# Patient Record
Sex: Female | Born: 1995 | Hispanic: No | Marital: Single | Smoking: Never smoker
Health system: Southern US, Community
[De-identification: ages and names within clinical notes are randomized; demographics above are authoritative.]

## PROBLEM LIST (undated history)

## (undated) ENCOUNTER — Inpatient Hospital Stay (HOSPITAL_COMMUNITY): Payer: Self-pay

## (undated) DIAGNOSIS — Z789 Other specified health status: Secondary | ICD-10-CM

## (undated) HISTORY — PX: GANGLION CYST EXCISION: SHX1691

---
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
## 2013-11-23 ENCOUNTER — Emergency Department (HOSPITAL_COMMUNITY): Admission: EM | Admit: 2013-11-23 | Discharge: 2013-11-23 | Payer: Medicaid Other | Source: Home / Self Care

## 2014-04-26 ENCOUNTER — Encounter (HOSPITAL_COMMUNITY): Payer: Self-pay | Admitting: *Deleted

## 2014-04-26 ENCOUNTER — Inpatient Hospital Stay (HOSPITAL_COMMUNITY)
Admission: AD | Admit: 2014-04-26 | Discharge: 2014-04-26 | Disposition: A | Discharge status: Home / Self Care | Payer: Medicaid Other | Source: Ambulatory Visit | Attending: Obstetrics & Gynecology | Admitting: Obstetrics & Gynecology

## 2014-04-26 DIAGNOSIS — N912 Amenorrhea, unspecified: Secondary | ICD-10-CM

## 2014-04-26 DIAGNOSIS — Z3201 Encounter for pregnancy test, result positive: Secondary | ICD-10-CM | POA: Insufficient documentation

## 2014-04-26 DIAGNOSIS — Z3200 Encounter for pregnancy test, result unknown: Secondary | ICD-10-CM | POA: Diagnosis present

## 2014-04-26 HISTORY — DX: Other specified health status: Z78.9

## 2014-04-26 LAB — POCT PREGNANCY, URINE: Preg Test, Ur: POSITIVE — AB

## 2014-04-26 NOTE — MAU Note (Addendum)
Needs preg verification to file for medicaid. 4+HPT.  Denies any problems.

## 2014-04-26 NOTE — MAU Provider Note (Signed)
  History     CSN: [id number]  Arrival date and time: 04/26/14 0904   None     Chief Complaint  Patient presents with  . Possible Pregnancy   HPI This is a 18 y.o. female at [redacted]w[redacted]d by LMP who presents with request for proof of pregnancy letter. Had 4 positive tests at home. Has no complaints. Has no OB/GYN or primary care provider.   OB History   Grav Para Term Preterm Abortions TAB SAB Ect Mult Living   1               Past Medical History  Diagnosis Date  . Medical history non-contributory     Past Surgical History  Procedure Laterality Date  . Cystectomy  2009    from wrist  . Ganglion cyst excision      Family History  Problem Relation Age of Onset  . Diabetes Mother     History  Substance Use Topics  . Smoking status: Never Smoker   . Smokeless tobacco: Never Used  . Alcohol Use: No    Allergies: No Known Allergies  Prescriptions prior to admission  Medication Sig Dispense Refill  . cetirizine (ZYRTEC) 10 MG tablet Take 1 tablet (10 mg total) by mouth daily.  30 tablet  0    Review of Systems  Constitutional: Negative for fever, chills and malaise/fatigue.  Gastrointestinal: Negative for nausea, vomiting and abdominal pain.  Neurological: Negative for dizziness.   Physical Exam   Blood pressure 118/70, pulse 98, temperature 98.3 F (36.8 C), temperature source Oral, resp. rate 18, height 4' 10.5" (1.486 m), weight 103 lb (46.72 kg), last menstrual period 03/19/2014.  Physical Exam  Constitutional: She is oriented to person, place, and time. She appears well-developed and well-nourished. No distress.  HENT:  Head: Normocephalic.  Cardiovascular: Normal rate.   Respiratory: Effort normal.  Genitourinary:  Exam not indicated   Musculoskeletal: Normal range of motion.  Neurological: She is alert and oriented to person, place, and time.  Skin: Skin is warm and dry.  Psychiatric: She has a normal mood and affect.    MAU Course   Procedures  MDM Results for orders placed during the hospital encounter of 04/26/14 (from the past 24 hour(s))  POCT PREGNANCY, URINE     Status: Abnormal   Collection Time    04/26/14  9:25 AM      Result Value Ref Range   Preg Test, Ur POSITIVE (*) NEGATIVE     Assessment and Plan  A:  Pregnancy at [redacted]w[redacted]d by LMP      No complaints   P:  Proof of pregnancy letter provided       Instructed in early pregnancy guidelines       Referred to [facility]       Provider list and medicaid instructions.  [facility] 04/26/2014, 9:29 AM

## 2014-04-26 NOTE — MAU Provider Note (Signed)
Attestation of Attending Supervision of Advanced Practitioner (PA/CNM/NP): Evaluation and management procedures were performed by the Advanced Practitioner under my supervision and collaboration.  I have reviewed the Advanced Practitioner's note and chart, and I agree with the management and plan.  Jewel Mcafee, MD, FACOG Attending Obstetrician & Gynecologist Faculty Practice, [facility] -    

## 2014-04-26 NOTE — Discharge Instructions (Signed)
First Trimester of Pregnancy The first trimester of pregnancy is from week 1 until the end of week 12 (months 1 through 3). A week after a sperm fertilizes an egg, the egg will implant on the wall of the uterus. This embryo will begin to develop into a baby. Genes from you and your partner are forming the baby. The female genes determine whether the baby is a boy or a girl. At 6-8 weeks, the eyes and face are formed, and the heartbeat can be seen on ultrasound. At the end of 12 weeks, all the baby's organs are formed.  Now that you are pregnant, you will want to do everything you can to have a healthy baby. Two of the most important things are to get good prenatal care and to follow your health care provider's instructions. Prenatal care is all the medical care you receive before the baby's birth. This care will help prevent, find, and treat any problems during the pregnancy and childbirth. BODY CHANGES Your body goes through many changes during pregnancy. The changes vary from woman to woman.   You may gain or lose a couple of pounds at first.  You may feel sick to your stomach (nauseous) and throw up (vomit). If the vomiting is uncontrollable, call your health care provider.  You may tire easily.  You may develop headaches that can be relieved by medicines approved by your health care provider.  You may urinate more often. Painful urination may mean you have a bladder infection.  You may develop heartburn as a result of your pregnancy.  You may develop constipation because certain hormones are causing the muscles that push waste through your intestines to slow down.  You may develop hemorrhoids or swollen, bulging veins (varicose veins).  Your breasts may begin to grow larger and become tender. Your nipples may stick out more, and the tissue that surrounds them (areola) may become darker.  Your gums may bleed and may be sensitive to brushing and flossing.  Dark spots or blotches (chloasma,  mask of pregnancy) may develop on your face. This will likely fade after the baby is born.  Your menstrual periods will stop.  You may have a loss of appetite.  You may develop cravings for certain kinds of food.  You may have changes in your emotions from day to day, such as being excited to be pregnant or being concerned that something may go wrong with the pregnancy and baby.  You may have more vivid and strange dreams.  You may have changes in your hair. These can include thickening of your hair, rapid growth, and changes in texture. Some women also have hair loss during or after pregnancy, or hair that feels dry or thin. Your hair will most likely return to normal after your baby is born. WHAT TO EXPECT AT YOUR PRENATAL VISITS During a routine prenatal visit:  You will be weighed to make sure you and the baby are growing normally.  Your blood pressure will be taken.  Your abdomen will be measured to track your baby's growth.  The fetal heartbeat will be listened to starting around week 10 or 12 of your pregnancy.  Test results from any previous visits will be discussed. Your health care provider may ask you:  How you are feeling.  If you are feeling the baby move.  If you have had any abnormal symptoms, such as leaking fluid, bleeding, severe headaches, or abdominal cramping.  If you have any questions. Other tests   that may be performed during your first trimester include:  Blood tests to find your blood type and to check for the presence of any previous infections. They will also be used to check for low iron levels (anemia) and Rh antibodies. Later in the pregnancy, blood tests for diabetes will be done along with other tests if problems develop.  Urine tests to check for infections, diabetes, or protein in the urine.  An ultrasound to confirm the proper growth and development of the baby.  An amniocentesis to check for possible genetic problems.  Fetal screens for  spina bifida and Down syndrome.  You may need other tests to make sure you and the baby are doing well. HOME CARE INSTRUCTIONS  Medicines  Follow your health care provider's instructions regarding medicine use. Specific medicines may be either safe or unsafe to take during pregnancy.  Take your prenatal vitamins as directed.  If you develop constipation, try taking a stool softener if your health care provider approves. Diet  Eat regular, well-balanced meals. Choose a variety of foods, such as meat or vegetable-based protein, fish, milk and low-fat dairy products, vegetables, fruits, and whole grain breads and cereals. Your health care provider will help you determine the amount of weight gain that is right for you.  Avoid raw meat and uncooked cheese. These carry germs that can cause birth defects in the baby.  Eating four or five small meals rather than three large meals a day may help relieve nausea and vomiting. If you start to feel nauseous, eating a few soda crackers can be helpful. Drinking liquids between meals instead of during meals also seems to help nausea and vomiting.  If you develop constipation, eat more high-fiber foods, such as fresh vegetables or fruit and whole grains. Drink enough fluids to keep your urine clear or pale yellow. Activity and Exercise  Exercise only as directed by your health care provider. Exercising will help you:  Control your weight.  Stay in shape.  Be prepared for labor and delivery.  Experiencing pain or cramping in the lower abdomen or low back is a good sign that you should stop exercising. Check with your health care provider before continuing normal exercises.  Try to avoid standing for long periods of time. Move your legs often if you must stand in one place for a long time.  Avoid heavy lifting.  Wear low-heeled shoes, and practice good posture.  You may continue to have sex unless your health care provider directs you  otherwise. Relief of Pain or Discomfort  Wear a good support bra for breast tenderness.   Take warm sitz baths to soothe any pain or discomfort caused by hemorrhoids. Use hemorrhoid cream if your health care provider approves.   Rest with your legs elevated if you have leg cramps or low back pain.  If you develop varicose veins in your legs, wear support hose. Elevate your feet for 15 minutes, 3-4 times a day. Limit salt in your diet. Prenatal Care  Schedule your prenatal visits by the twelfth week of pregnancy. They are usually scheduled monthly at first, then more often in the last 2 months before delivery.  Write down your questions. Take them to your prenatal visits.  Keep all your prenatal visits as directed by your health care provider. Safety  Wear your seat belt at all times when driving.  Make a list of emergency phone numbers, including numbers for family, friends, the hospital, and police and fire departments. General Tips    Ask your health care provider for a referral to a local prenatal education class. Begin classes no later than at the beginning of month 6 of your pregnancy.  Ask for help if you have counseling or nutritional needs during pregnancy. Your health care provider can offer advice or refer you to specialists for help with various needs.  Do not use hot tubs, steam rooms, or saunas.  Do not douche or use tampons or scented sanitary pads.  Do not cross your legs for long periods of time.  Avoid cat litter boxes and soil used by cats. These carry germs that can cause birth defects in the baby and possibly loss of the fetus by miscarriage or stillbirth.  Avoid all smoking, herbs, alcohol, and medicines not prescribed by your health care provider. Chemicals in these affect the formation and growth of the baby.  Schedule a dentist appointment. At home, brush your teeth with a soft toothbrush and be gentle when you floss. SEEK MEDICAL CARE IF:   You have  dizziness.  You have mild pelvic cramps, pelvic pressure, or nagging pain in the abdominal area.  You have persistent nausea, vomiting, or diarrhea.  You have a bad smelling vaginal discharge.  You have pain with urination.  You notice increased swelling in your face, hands, legs, or ankles. SEEK IMMEDIATE MEDICAL CARE IF:   You have a fever.  You are leaking fluid from your vagina.  You have spotting or bleeding from your vagina.  You have severe abdominal cramping or pain.  You have rapid weight gain or loss.  You vomit blood or material that looks like coffee grounds.  You are exposed to German measles and have never had them.  You are exposed to fifth disease or chickenpox.  You develop a severe headache.  You have shortness of breath.  You have any kind of trauma, such as from a fall or a car accident. Document Released: 07/13/2001 Document Revised: 12/03/2013 Document Reviewed: 05/29/2013 Exit[employer] Patient Information 2015 [employer]. This information is not intended to replace advice given to you by your health care provider. Make sure you discuss any questions you have with your health care provider.  

## 2014-05-23 ENCOUNTER — Other Ambulatory Visit (HOSPITAL_COMMUNITY): Payer: Self-pay | Admitting: Physician Assistant

## 2014-05-23 DIAGNOSIS — Z3682 Encounter for antenatal screening for nuchal translucency: Secondary | ICD-10-CM

## 2014-05-23 LAB — OB RESULTS CONSOLE ABO/RH: RH TYPE: POSITIVE

## 2014-05-23 LAB — OB RESULTS CONSOLE HEPATITIS B SURFACE ANTIGEN: HEP B S AG: NEGATIVE

## 2014-05-23 LAB — OB RESULTS CONSOLE GC/CHLAMYDIA
CHLAMYDIA, DNA PROBE: NEGATIVE
Gonorrhea: NEGATIVE

## 2014-05-23 LAB — OB RESULTS CONSOLE ANTIBODY SCREEN: ANTIBODY SCREEN: NEGATIVE

## 2014-05-23 LAB — OB RESULTS CONSOLE HIV ANTIBODY (ROUTINE TESTING): HIV: NONREACTIVE

## 2014-05-23 LAB — OB RESULTS CONSOLE RUBELLA ANTIBODY, IGM: Rubella: IMMUNE

## 2014-05-23 LAB — OB RESULTS CONSOLE RPR: RPR: NONREACTIVE

## 2014-06-03 ENCOUNTER — Encounter (HOSPITAL_COMMUNITY): Payer: Self-pay | Admitting: *Deleted

## 2014-06-12 ENCOUNTER — Ambulatory Visit (HOSPITAL_COMMUNITY)
Admission: RE | Admit: 2014-06-12 | Discharge: 2014-06-12 | Disposition: A | Discharge status: Home / Self Care | Payer: Medicaid Other | Source: Ambulatory Visit | Attending: Physician Assistant | Admitting: Physician Assistant

## 2014-06-12 DIAGNOSIS — Z3A12 12 weeks gestation of pregnancy: Secondary | ICD-10-CM | POA: Diagnosis not present

## 2014-06-12 DIAGNOSIS — Z3682 Encounter for antenatal screening for nuchal translucency: Secondary | ICD-10-CM

## 2014-06-12 DIAGNOSIS — Z36 Encounter for antenatal screening of mother: Secondary | ICD-10-CM | POA: Insufficient documentation

## 2014-06-20 ENCOUNTER — Other Ambulatory Visit (HOSPITAL_COMMUNITY): Payer: Self-pay

## 2014-07-18 ENCOUNTER — Other Ambulatory Visit (HOSPITAL_COMMUNITY): Payer: Self-pay | Admitting: Nurse Practitioner

## 2014-07-18 DIAGNOSIS — Z3689 Encounter for other specified antenatal screening: Secondary | ICD-10-CM

## 2014-08-01 ENCOUNTER — Ambulatory Visit (HOSPITAL_COMMUNITY)
Admission: RE | Admit: 2014-08-01 | Discharge: 2014-08-01 | Disposition: A | Discharge status: Home / Self Care | Payer: Medicaid Other | Source: Ambulatory Visit | Attending: Nurse Practitioner | Admitting: Nurse Practitioner

## 2014-08-01 DIAGNOSIS — Z3A19 19 weeks gestation of pregnancy: Secondary | ICD-10-CM | POA: Diagnosis present

## 2014-08-01 DIAGNOSIS — Z36 Encounter for antenatal screening of mother: Secondary | ICD-10-CM | POA: Insufficient documentation

## 2014-08-01 DIAGNOSIS — Z3689 Encounter for other specified antenatal screening: Secondary | ICD-10-CM

## 2014-08-02 NOTE — L&D Delivery Note (Cosign Needed)
Delivery Note Pt pushed well x 2 hours and at 6:58 AM a viable female was delivered via Vaginal, Spontaneous Delivery (Presentation: OA ).  APGAR: 8, 9; weight 8 lb 6.8 oz (3822 g).  Peds present at del, but infant crying spont w/ stimulation and bulb suctioning, so they were dismissed. Cord clamped and cut by FOB. Hospital cord blood sample collected. Placenta status: Intact, Spontaneous.  Cord: 3 vessels    Anesthesia: Epidural  Episiotomy: None Lacerations: 1st degree;Sulcus;Perineal Suture Repair: 3.0 vicryl Est. Blood Loss (mL):    Mom to postpartum.  Baby to Couplet care / Skin to Skin.  Cam HaiSHAW, KIMBERLY CNM 01/02/2015, 7:25 AM

## 2014-08-03 DIAGNOSIS — Z3A19 19 weeks gestation of pregnancy: Secondary | ICD-10-CM | POA: Insufficient documentation

## 2014-08-03 DIAGNOSIS — Z3689 Encounter for other specified antenatal screening: Secondary | ICD-10-CM | POA: Insufficient documentation

## 2014-09-16 ENCOUNTER — Other Ambulatory Visit (HOSPITAL_COMMUNITY): Payer: Self-pay | Admitting: Nurse Practitioner

## 2014-09-16 DIAGNOSIS — Z3689 Encounter for other specified antenatal screening: Secondary | ICD-10-CM

## 2014-09-25 ENCOUNTER — Ambulatory Visit (HOSPITAL_COMMUNITY)
Admission: RE | Admit: 2014-09-25 | Discharge: 2014-09-25 | Disposition: A | Discharge status: Home / Self Care | Payer: Medicaid Other | Source: Ambulatory Visit | Attending: Nurse Practitioner | Admitting: Nurse Practitioner

## 2014-09-25 DIAGNOSIS — Z3689 Encounter for other specified antenatal screening: Secondary | ICD-10-CM

## 2014-09-25 DIAGNOSIS — Z36 Encounter for antenatal screening of mother: Secondary | ICD-10-CM | POA: Diagnosis present

## 2014-09-25 DIAGNOSIS — Z3A27 27 weeks gestation of pregnancy: Secondary | ICD-10-CM | POA: Insufficient documentation

## 2014-09-25 DIAGNOSIS — Z0489 Encounter for examination and observation for other specified reasons: Secondary | ICD-10-CM | POA: Insufficient documentation

## 2014-09-25 DIAGNOSIS — IMO0002 Reserved for concepts with insufficient information to code with codable children: Secondary | ICD-10-CM | POA: Insufficient documentation

## 2014-12-02 LAB — OB RESULTS CONSOLE GC/CHLAMYDIA
Chlamydia: NEGATIVE
GC PROBE AMP, GENITAL: NEGATIVE

## 2014-12-02 LAB — OB RESULTS CONSOLE GBS: GBS: NEGATIVE

## 2014-12-23 ENCOUNTER — Encounter (HOSPITAL_COMMUNITY): Payer: Self-pay | Admitting: *Deleted

## 2014-12-23 ENCOUNTER — Inpatient Hospital Stay (HOSPITAL_COMMUNITY)
Admission: AD | Admit: 2014-12-23 | Discharge: 2014-12-23 | Disposition: A | Discharge status: Home / Self Care | Payer: Medicaid Other | Source: Ambulatory Visit | Attending: Obstetrics & Gynecology | Admitting: Obstetrics & Gynecology

## 2014-12-23 DIAGNOSIS — R42 Dizziness and giddiness: Secondary | ICD-10-CM | POA: Diagnosis present

## 2014-12-23 DIAGNOSIS — O9989 Other specified diseases and conditions complicating pregnancy, childbirth and the puerperium: Secondary | ICD-10-CM | POA: Insufficient documentation

## 2014-12-23 DIAGNOSIS — Z833 Family history of diabetes mellitus: Secondary | ICD-10-CM | POA: Insufficient documentation

## 2014-12-23 DIAGNOSIS — Z3A39 39 weeks gestation of pregnancy: Secondary | ICD-10-CM | POA: Diagnosis not present

## 2014-12-23 DIAGNOSIS — E162 Hypoglycemia, unspecified: Secondary | ICD-10-CM | POA: Insufficient documentation

## 2014-12-23 LAB — URINALYSIS, ROUTINE W REFLEX MICROSCOPIC
Bilirubin Urine: NEGATIVE
Glucose, UA: NEGATIVE mg/dL
Hgb urine dipstick: NEGATIVE
KETONES UR: NEGATIVE mg/dL
Leukocytes, UA: NEGATIVE
Nitrite: NEGATIVE
Protein, ur: NEGATIVE mg/dL
SPECIFIC GRAVITY, URINE: 1.025 (ref 1.005–1.030)
UROBILINOGEN UA: 1 mg/dL (ref 0.0–1.0)
pH: 5.5 (ref 5.0–8.0)

## 2014-12-23 MED ORDER — CETIRIZINE HCL 10 MG PO TABS: 10.0000 mg | ORAL_TABLET | Freq: Every day | ORAL | Status: AC

## 2014-12-23 NOTE — MAU Provider Note (Signed)
  History   G1 at 39.6 wks was standing up cooking and had a episode of dizziness, weakness blurred vison and spots lasted about 10 min. After reviewing pts diet for the day she had earlier eaten sweet cereal and had not had any substantial protien all day. Discussed importance of protein in diet and decreasing carb load.  CSN: [id number]  Arrival date and time: 12/23/14 [id number]   First Provider Initiated Contact with Patient 12/23/14 1925      Chief Complaint  Patient presents with  . Dizziness  . Numbness   HPI  OB History    Gravida Para Term Preterm AB TAB SAB Ectopic Multiple Living   1               Past Medical History  Diagnosis Date  . Medical history non-contributory     Past Surgical History  Procedure Laterality Date  . Cystectomy  2009    from wrist  . Ganglion cyst excision      Family History  Problem Relation Age of Onset  . Diabetes Mother     History  Substance Use Topics  . Smoking status: Never Smoker   . Smokeless tobacco: Never Used  . Alcohol Use: No    Allergies: No Known Allergies  Prescriptions prior to admission  Medication Sig Dispense Refill Last Dose  . cetirizine (ZYRTEC) 10 MG tablet Take 1 tablet (10 mg total) by mouth daily. 30 tablet 0     Review of Systems  HENT: Negative.   Eyes: Positive for blurred vision.  Respiratory: Negative.   Cardiovascular: Negative.   Gastrointestinal: Negative.   Genitourinary: Negative.   Musculoskeletal: Negative.   Skin: Negative.   Neurological: Positive for weakness.  Endo/Heme/Allergies: Negative.   Psychiatric/Behavioral: Negative.    Physical Exam   Blood pressure 125/80, pulse 85, temperature 98.5 F (36.9 C), temperature source Oral, resp. rate 18, weight 145 lb (65.772 kg), last menstrual period 03/19/2014.  Physical Exam  Constitutional: She is oriented to person, place, and time. She appears well-developed and well-nourished.  HENT:  Head: Normocephalic.  Eyes: Pupils  are equal, round, and reactive to light.  Neck: Normal range of motion.  Cardiovascular: Normal rate, regular rhythm, normal heart sounds and intact distal pulses.   Respiratory: Effort normal and breath sounds normal.  GI: Soft.  Genitourinary: Vagina normal.  Musculoskeletal: Normal range of motion.  Neurological: She is alert and oriented to person, place, and time. She has normal reflexes.  Skin: Skin is warm and dry.  Psychiatric: She has a normal mood and affect. Her behavior is normal. Judgment and thought content normal.    MAU Course  Procedures  MDM Hypoglycemic episode  Assessment and Plan  Stable pt and fetus will d/c home with reassurring FHR  Sasha Rueth DARLENE 12/23/2014, 7:28 PM

## 2014-12-23 NOTE — MAU Note (Signed)
Pt states that at 5:45 she started feeling dizzy and lightheaded, then about 30 minutes it got real bad and she got blurred vision with light flickers, a very bad headache, and then her tongue, left side of her face and left arm went numb. She felt like she was going to faint so she went to the bed and almost fell back (but did not). She said that for about 12 minutes she couldn't see hardly at all bc of the dizziness and light flickers. She now feels better, just a minor headache and a little tingly in her hand. Has no OB complaints. Denies LOF, VB or CTX. +FM.

## 2014-12-23 NOTE — MAU Note (Signed)
Started about ago, very dizzy, facial numbness on right side of face; headache, seeing white lights.

## 2014-12-23 NOTE — Discharge Instructions (Signed)
Hipoglucemia (Hypoglycemia) La hipoglucemia se produce cuando el nivel de glucosa en la sangre es demasiado bajo. La glucosa es un tipo de azcar, que es la principal fuente de energa del cuerpo. Hormonas, como la insulina y el glucagn, controlan el nivel de glucosa en la sangre. La insulina reduce el nivel de la glucosa en la sangre, mientras que el glucagn lo aumenta. Si tiene demasiada insulina en el torrente sanguneo o si no ingiere suficientes alimentos que contengan azcar, puede desarrollar hipoglucemia. Esta afeccin puede manifestarse en personas con o sin diabetes. Puede desarrollarse rpidamente y, como consecuencia, necesitar atencin urgente.  CAUSAS   Omitir o retrasar comidas.  No ingerir demasiados carbohidratos en las comidas.  Consumo excesivo de medicamentos para la diabetes.  No coordinar el horario de la toma de medicamentos por va oral para la diabetes o de insulina, con las comidas, las colaciones y el ejercicio.  Nuseas y vmitos.  Algunos medicamentos.  Enfermedades graves, como hepatitis, trastornos renales y ciertos trastornos de la alimentacin.  Aumento de la actividad fsica o el ejercicio, sin ingerir alimentos adicionales o ajustar los medicamentos.  Beber alcohol en exceso.  Un trastorno nervioso que afecta las funciones corporales, como la frecuencia cardaca, presin arterial y digestin (neuropata autnoma).  Una afeccin en la cual los msculos del estmago no funcionan apropiadamente (gastroparesia). Por consiguiente, los medicamentos y los alimentos no pueden absorberse correctamente.  Pocas veces un tumor de pncreas puede producir demasiada insulina. SNTOMAS   Hambre.  Sudoraciones (diaforesis).  Cambio en la temperatura corporal.  Temblores.  Dolor de cabeza.  Ansiedad.  Aturdimiento.  Irritabilidad.  Dificultad para concentrarse.  Sequedad en la boca.  Hormigueo o adormecimiento de las manos y los pies.  Sueo  agitado o alteraciones del sueo.  Alteracin en el habla y la coordinacin.  Cambio en el estado mental.  Convulsiones breves o prolongadas.  Agresividad  Somnolencia (letargo).  Debilidad.  Aumento de la frecuencia cardaca o palpitaciones.  Confusin.  Piel plida o de tono gris.  Visin borrosa o doble.  Desmayos. DIAGNSTICO  Le harn un examen fsico y una historia clnica. Su mdico puede hacer un diagnstico en funcin de sus sntomas. Pueden realizarle anlisis de sangre y otras pruebas de laboratorio para confirmar el diagnstico. Una vez realizado el diagnstico, su mdico observar si los signos y sntomas desaparecen, una vez que aumenta el nivel de la glucosa en la sangre.  TRATAMIENTO  Por lo general, la hipoglucemia puede tratarse fcilmente cuando se observan sntomas.  Controle su nivel de glucosa en la sangre. Si es menor que 70 mg/dl, tome uno de los siguientes:  3 o 4 comprimidos de glucosa.   taza de jugo.   taza de una gaseosa comn.  1 taza de leche descremada.   a 1 pomo de glucosa en gel.  5 a 6 caramelos duros.  Evite las bebidas o los alimentos con alto contenido de grasa, que pueden retrasar el aumento de los niveles de glucosa en la sangre.  No ingiera ms de la cantidad recomendada de alimentos, bebidas, gel o comprimidos que contengan azcar. Si lo hace, el nivel de glucosa en la sangre subir demasiado.  Espere de 10 a 15 minutos y vuelva a controlar su nivel de glucosa en la sangre. Si an es menor que 70 mg/dl o est por debajo del intervalo indicado, repita el tratamiento.  Ingiera una colacin si falta ms de 1 hora para su prxima comida. Es posible que, alguna vez, su   nivel de glucosa en la sangre baje demasiado, de modo que no pueda tratarse en su casa, cuando comience a observar los sntomas. Probablemente necesite ayuda. Incluso puede desmayarse o ser incapaz de tragar. Si no puede tratarse por s solo, alguien deber  llevarlo al hospital.  INSTRUCCIONES PARA EL CUIDADO EN EL HOGAR  Si tiene diabetes, siga su plan de control de la diabetes:  Tome los medicamentos segn las indicaciones.  Siga el plan de ejercicio.  Siga el plan de comidas. No saltee comidas. Coma a horario.  Controle su nivel de glucosa en la sangre peridicamente. Controle su nivel de glucosa en la sangre antes y despus de ejercitarse. Si hace ejercicio durante ms tiempo o de manera diferente de lo habitual, asegrese de controlar su nivel de glucosa en la sangre con mayor frecuencia.  Use su pulsera o medalla de alerta mdica, que indica que usted tiene diabetes.  Identifique la causa de su hipoglucemia. Luego, desarrolle formas de prevenir la recurrencia de la hipoglucemia.  No tome un bao o una ducha caliente inmediatamente despus de una inyeccin de insulina.  Siempre lleve consigo el tratamiento. Las pastillas de glucosa son fciles de llevar.  Si va a beber alcohol, bbalo solo con las comidas.  Informe a familiares y amigos qu pueden hacer para mantenerlo seguro durante una convulsin. Esto puede incluir retirar objetos duros o filosos del rea o colocarlo de costado.  Mantenga un peso saludable. SOLICITE ATENCIN MDICA SI:   Tiene problemas para mantener el nivel de glucosa en la sangre dentro del intervalo indicado.  Tiene episodios frecuentes de hipoglucemia.  Siente efectos secundarios por los medicamentos prescritos.  No est seguro por qu su nivel de glucosa en la sangre es tan bajo.  Nota cambios o un nuevo problema en la visin . SOLICITE ATENCIN MDICA DE INMEDIATO SI:   Presenta confusin.  Se produce un cambio en su estado mental.  Es incapaz de tragar.  Se desmaya. Document Released: 07/19/2005 Document Revised: 07/24/2013 ExitCare Patient Information 2015 [employer]. This information is not intended to replace advice given to you by your health care provider. Make sure you discuss  any questions you have with your health care provider.  

## 2014-12-26 ENCOUNTER — Other Ambulatory Visit (HOSPITAL_COMMUNITY): Payer: Self-pay | Admitting: Nurse Practitioner

## 2014-12-26 ENCOUNTER — Telehealth (HOSPITAL_COMMUNITY): Payer: Self-pay | Admitting: *Deleted

## 2014-12-26 ENCOUNTER — Encounter (HOSPITAL_COMMUNITY): Payer: Self-pay | Admitting: *Deleted

## 2014-12-26 DIAGNOSIS — O480 Post-term pregnancy: Secondary | ICD-10-CM

## 2014-12-26 NOTE — Telephone Encounter (Signed)
Preadmission screen  

## 2014-12-27 ENCOUNTER — Inpatient Hospital Stay (HOSPITAL_COMMUNITY)
Admission: EM | Admit: 2014-12-27 | Discharge: 2014-12-27 | Disposition: A | Discharge status: Home / Self Care | Payer: Medicaid Other | Source: Ambulatory Visit | Attending: Family Medicine | Admitting: Family Medicine

## 2014-12-27 ENCOUNTER — Encounter (HOSPITAL_COMMUNITY): Payer: Self-pay | Admitting: *Deleted

## 2014-12-27 ENCOUNTER — Ambulatory Visit (HOSPITAL_COMMUNITY)
Admission: RE | Admit: 2014-12-27 | Discharge: 2014-12-27 | Disposition: A | Discharge status: Home / Self Care | Payer: Medicaid Other | Source: Ambulatory Visit | Attending: Nurse Practitioner | Admitting: Nurse Practitioner

## 2014-12-27 DIAGNOSIS — O480 Post-term pregnancy: Secondary | ICD-10-CM

## 2014-12-27 DIAGNOSIS — Z3A40 40 weeks gestation of pregnancy: Secondary | ICD-10-CM | POA: Insufficient documentation

## 2014-12-27 DIAGNOSIS — Z3A41 41 weeks gestation of pregnancy: Secondary | ICD-10-CM | POA: Diagnosis not present

## 2014-12-27 NOTE — MAU Provider Note (Signed)
Patient is 19 y.o. G1P0 10274w3d referred from clinic after BPP 6/8 for NST.  NST reviewed and reactive.  Perry MountACOSTA,Kritika Stukes ROCIO, MD 12:21 PM

## 2014-12-27 NOTE — MAU Note (Signed)
Pt here from clinic this morning for further monitoring after BPP 6/8.  Pt states baby is very active.  Denies vaginal bleeding or ROM.

## 2015-01-01 ENCOUNTER — Inpatient Hospital Stay (HOSPITAL_COMMUNITY): Payer: Medicaid Other | Admitting: Anesthesiology

## 2015-01-01 ENCOUNTER — Encounter (HOSPITAL_COMMUNITY): Payer: Self-pay

## 2015-01-01 ENCOUNTER — Inpatient Hospital Stay (HOSPITAL_COMMUNITY)
Admission: RE | Admit: 2015-01-01 | Discharge: 2015-01-04 | DRG: 775 | Disposition: A | Discharge status: Home / Self Care | Payer: Medicaid Other | Source: Ambulatory Visit | Attending: Family Medicine | Admitting: Family Medicine

## 2015-01-01 DIAGNOSIS — D62 Acute posthemorrhagic anemia: Secondary | ICD-10-CM | POA: Diagnosis not present

## 2015-01-01 DIAGNOSIS — Z3A41 41 weeks gestation of pregnancy: Secondary | ICD-10-CM | POA: Diagnosis present

## 2015-01-01 DIAGNOSIS — IMO0001 Reserved for inherently not codable concepts without codable children: Secondary | ICD-10-CM

## 2015-01-01 DIAGNOSIS — O9081 Anemia of the puerperium: Secondary | ICD-10-CM | POA: Diagnosis not present

## 2015-01-01 DIAGNOSIS — Z3490 Encounter for supervision of normal pregnancy, unspecified, unspecified trimester: Secondary | ICD-10-CM

## 2015-01-01 DIAGNOSIS — O480 Post-term pregnancy: Principal | ICD-10-CM | POA: Diagnosis present

## 2015-01-01 LAB — CBC
HCT: 34.6 % — ABNORMAL LOW (ref 36.0–46.0)
Hemoglobin: 11.4 g/dL — ABNORMAL LOW (ref 12.0–15.0)
MCH: 27.8 pg (ref 26.0–34.0)
MCHC: 32.9 g/dL (ref 30.0–36.0)
MCV: 84.4 fL (ref 78.0–100.0)
PLATELETS: 208 10*3/uL (ref 150–400)
RBC: 4.1 MIL/uL (ref 3.87–5.11)
RDW: 14.8 % (ref 11.5–15.5)
WBC: 11.1 10*3/uL — ABNORMAL HIGH (ref 4.0–10.5)

## 2015-01-01 LAB — RPR: RPR: NONREACTIVE

## 2015-01-01 MED ORDER — TERBUTALINE SULFATE 1 MG/ML IJ SOLN: 0.2500 mg | Freq: Once | INTRAMUSCULAR | Status: AC | PRN

## 2015-01-01 MED ORDER — LACTATED RINGERS IV SOLN
500.0000 mL | INTRAVENOUS | Status: DC | PRN
  Administered 2015-01-02 (×4): 500 mL via INTRAVENOUS

## 2015-01-01 MED ORDER — OXYCODONE-ACETAMINOPHEN 5-325 MG PO TABS: 1.0000 | ORAL_TABLET | ORAL | Status: DC | PRN

## 2015-01-01 MED ORDER — ONDANSETRON HCL 4 MG/2ML IJ SOLN: 4.0000 mg | Freq: Four times a day (QID) | INTRAMUSCULAR | Status: DC | PRN

## 2015-01-01 MED ORDER — FENTANYL CITRATE (PF) 100 MCG/2ML IJ SOLN
100.0000 ug | INTRAMUSCULAR | Status: DC | PRN
  Administered 2015-01-01 (×2): 100 ug via INTRAVENOUS
  Filled 2015-01-01 (×2): qty 2

## 2015-01-01 MED ORDER — OXYCODONE-ACETAMINOPHEN 5-325 MG PO TABS: 2.0000 | ORAL_TABLET | ORAL | Status: DC | PRN

## 2015-01-01 MED ORDER — OXYTOCIN BOLUS FROM INFUSION: 500.0000 mL | INTRAVENOUS | Status: DC

## 2015-01-01 MED ORDER — ACETAMINOPHEN 325 MG PO TABS
650.0000 mg | ORAL_TABLET | ORAL | Status: DC | PRN
  Administered 2015-01-02: 650 mg via ORAL
  Filled 2015-01-01: qty 2

## 2015-01-01 MED ORDER — OXYTOCIN 40 UNITS IN LACTATED RINGERS INFUSION - SIMPLE MED: 62.5000 mL/h | INTRAVENOUS | Status: DC

## 2015-01-01 MED ORDER — LACTATED RINGERS IV SOLN
INTRAVENOUS | Status: DC
  Administered 2015-01-01 (×2): via INTRAVENOUS

## 2015-01-01 MED ORDER — CITRIC ACID-SODIUM CITRATE 334-500 MG/5ML PO SOLN: 30.0000 mL | ORAL | Status: DC | PRN

## 2015-01-01 MED ORDER — EPHEDRINE 5 MG/ML INJ: 10.0000 mg | INTRAVENOUS | Status: DC | PRN

## 2015-01-01 MED ORDER — OXYTOCIN 40 UNITS IN LACTATED RINGERS INFUSION - SIMPLE MED
1.0000 m[IU]/min | INTRAVENOUS | Status: DC
  Administered 2015-01-01: 2 m[IU]/min via INTRAVENOUS
  Filled 2015-01-01: qty 1000

## 2015-01-01 MED ORDER — LIDOCAINE HCL (PF) 1 % IJ SOLN
INTRAMUSCULAR | Status: DC | PRN
  Administered 2015-01-01 (×2): 8 mL

## 2015-01-01 MED ORDER — FENTANYL 2.5 MCG/ML BUPIVACAINE 1/10 % EPIDURAL INFUSION (WH - ANES)
14.0000 mL/h | INTRAMUSCULAR | Status: DC | PRN
  Administered 2015-01-01 – 2015-01-02 (×3): 14 mL/h via EPIDURAL
  Filled 2015-01-01 (×2): qty 125

## 2015-01-01 MED ORDER — DIPHENHYDRAMINE HCL 50 MG/ML IJ SOLN: 12.5000 mg | INTRAMUSCULAR | Status: DC | PRN

## 2015-01-01 MED ORDER — PHENYLEPHRINE 40 MCG/ML (10ML) SYRINGE FOR IV PUSH (FOR BLOOD PRESSURE SUPPORT)
80.0000 ug | PREFILLED_SYRINGE | INTRAVENOUS | Status: DC | PRN
  Filled 2015-01-01: qty 20

## 2015-01-01 MED ORDER — LIDOCAINE HCL (PF) 1 % IJ SOLN
30.0000 mL | INTRAMUSCULAR | Status: AC | PRN
  Administered 2015-01-02: 30 mL via SUBCUTANEOUS
  Filled 2015-01-01: qty 30

## 2015-01-01 NOTE — Progress Notes (Signed)
Labor Progress Note  S: Pt is comfortable, lying in bed. Not feeling contractions on epidural (station is ballotable).  O:  BP 101/54 mmHg  Pulse 100  Temp(Src) 98.3 F (36.8 C) (Oral)  Resp 15  Ht 4' 10.5" (1.486 m)  Wt 64.864 kg (143 lb)  BMI 29.37 kg/m2  SpO2 98%  LMP 03/19/2014 Cat I SVE: 4 cm dilated Station: Ballotable  A&P: 19 y.o. G1P0 7095w1d presenting for IOL for post dates. Pt is comfortable. Likely not in active labor currently with minimal cervical change.   Last dose Pitocin 18 mu/min 2219 SROM 2128, clear  Consider IUPC to better monitor contraction strength  Jingpeng He, Med Student 10:27 PM  RESIDENT ADDENDUM I have separately seen and examined the patient. I have discussed the findings and exam with the medical student and agree with the above note. I helped develop the management plan that is described in the student's note, and I agree with the content. Any changes I had have been made to the above note.   Caryl AdaJazma Phelps, DO PGY-1, [facility]

## 2015-01-01 NOTE — Anesthesia Procedure Notes (Signed)
Epidural Patient location during procedure: OB Start time: 01/01/2015 7:40 PM End time: 01/01/2015 7:44 PM  Staffing Anesthesiologist: Leilani AbleHATCHETT, Quante Pettry Performed by: anesthesiologist   Preanesthetic Checklist Completed: patient identified, surgical consent, pre-op evaluation, timeout performed, IV checked, risks and benefits discussed and monitors and equipment checked  Epidural Patient position: sitting Prep: site prepped and draped and DuraPrep Patient monitoring: continuous pulse ox and blood pressure Approach: midline Location: L3-L4 Injection technique: LOR air  Needle:  Needle type: Tuohy  Needle gauge: 17 G Needle length: 9 cm and 9 Needle insertion depth: 5 cm cm Catheter type: closed end flexible Catheter size: 19 Gauge Catheter at skin depth: 10 cm Test dose: negative and Other  Assessment Sensory level: T9 Events: blood not aspirated, injection not painful, no injection resistance, negative IV test and no paresthesia  Additional Notes Reason for block:procedure for pain

## 2015-01-01 NOTE — Progress Notes (Signed)
Pt reports ctxs are tolerable & doesn't need to breathe through them.

## 2015-01-01 NOTE — Progress Notes (Signed)
Patient ID: [name], female   DOB: June 19, 1996, 19 y.o.   MRN: [id number] Has done well  UCs have remained frequent so Pitocin is on 6414mu/min  FHR reactive UCs every 2 min  Dilation: 4 Effacement (%): 80 Station: -1, -2 Presentation: Vertex Exam by:: Doreatha MassedF. Morris, RNC  Continue plan of care

## 2015-01-01 NOTE — H&P (Signed)
[name] is a 19 y.o. female presenting for IOL for post dates. Maternal Medical History:  Reason for admission: Nausea. IOL for post dates  Fetal activity: Perceived fetal activity is normal.   Last perceived fetal movement was within the past hour.    Prenatal complications: No bleeding, HIV, PIH, IUGR, placental abnormality, preterm labor or substance abuse.   Prenatal Complications - Diabetes: none.    OB History    Gravida Para Term Preterm AB TAB SAB Ectopic Multiple Living   1         0     Past Medical History  Diagnosis Date  . Medical history non-contributory    Past Surgical History  Procedure Laterality Date  . Cystectomy  2009    from wrist  . Ganglion cyst excision     Family History: family history includes Deep vein thrombosis in her mother; Diabetes in her mother. Social History:  reports that she has never smoked. She has never used smokeless tobacco. She reports that she does not drink alcohol or use illicit drugs.   Prenatal Transfer Tool  Maternal Diabetes: No Genetic Screening: Normal Maternal Ultrasounds/Referrals: Normal Fetal Ultrasounds or other Referrals:  None Maternal Substance Abuse:  No Significant Maternal Medications:  None Significant Maternal Lab Results:  Lab values include: Group B Strep negative Other Comments:  None  Review of Systems  Constitutional: Positive for fever.  Respiratory: Negative for cough.   Cardiovascular: Positive for leg swelling.  Gastrointestinal: Negative for heartburn, nausea, vomiting and abdominal pain.  Musculoskeletal: Negative for back pain.  Skin: Negative for rash.  Neurological: Positive for headaches.    Dilation: 3.5 Effacement (%): 80 Station: -1, -2 Exam by:: F. Morris, RNC Blood pressure 124/74, pulse 91, temperature 98.2 F (36.8 C), temperature source Oral, resp. rate 20, height 4' 10.5" (1.486 m), weight 143 lb (64.864 kg), last menstrual period 03/19/2014. Maternal Exam:   Uterine Assessment: None at time of admission  Abdomen: Patient reports no abdominal tenderness. Fundal height is 41.   Fetal presentation: vertex  Introitus: Normal vulva. Normal vagina.  Ferning test: not done.  Nitrazine test: not done. Amniotic fluid character: not assessed.  Pelvis: adequate for delivery.   Cervix: Cervix evaluated by digital exam.     Fetal Exam Fetal Monitor Review: Mode: ultrasound.   Baseline rate: 145.  Variability: moderate (6-25 bpm).   Pattern: accelerations present and no decelerations.    Fetal State Assessment: Category I - tracings are normal.     Physical Exam  Constitutional: She is oriented to person, place, and time. She appears well-developed.  HENT:  Head: Normocephalic.  Eyes: Conjunctivae are normal.  Cardiovascular: Normal rate, regular rhythm, normal heart sounds and intact distal pulses.   Respiratory: Effort normal. No respiratory distress. She has no wheezes.  GI: Soft. There is no tenderness.  Neurological: She is alert and oriented to person, place, and time.  Skin: Skin is warm and dry.  Psychiatric: She has a normal mood and affect.    Prenatal labs: ABO, Rh: O/Positive/-- (10/22 0000) Antibody: Negative (10/22 0000) Rubella: Immune (10/22 0000) RPR: Nonreactive (10/22 0000)  HBsAg: Negative (10/22 0000)  HIV: Non-reactive (10/22 0000)  GBS: Negative (05/02 0000)   Assessment/Plan: A: 18 yo G1P0 at 41 wk 1 d presenting for IOL for post dates  Bishop score of 10  P: Admit to BS for IOL via Pitocin due to pre-existing cervical effacement     Desires epidural when in active labor  Routine labor orders   [facility] 01/01/2015, 10:22 AM

## 2015-01-01 NOTE — Anesthesia Preprocedure Evaluation (Signed)

## 2015-01-01 NOTE — Progress Notes (Signed)
Artelia LarocheM. Williams, CNM @ nurse's station reviewing central monitoring system.  CNM informed Pitocin remains @ 1038mu/min secondary pt having regular ctxs.

## 2015-01-02 ENCOUNTER — Encounter (HOSPITAL_COMMUNITY): Payer: Self-pay

## 2015-01-02 DIAGNOSIS — D62 Acute posthemorrhagic anemia: Secondary | ICD-10-CM

## 2015-01-02 DIAGNOSIS — O9081 Anemia of the puerperium: Secondary | ICD-10-CM

## 2015-01-02 DIAGNOSIS — O480 Post-term pregnancy: Secondary | ICD-10-CM

## 2015-01-02 DIAGNOSIS — Z3A41 41 weeks gestation of pregnancy: Secondary | ICD-10-CM

## 2015-01-02 MED ORDER — DIPHENHYDRAMINE HCL 25 MG PO CAPS: 25.0000 mg | ORAL_CAPSULE | Freq: Four times a day (QID) | ORAL | Status: DC | PRN

## 2015-01-02 MED ORDER — ZOLPIDEM TARTRATE 5 MG PO TABS: 5.0000 mg | ORAL_TABLET | Freq: Every evening | ORAL | Status: DC | PRN

## 2015-01-02 MED ORDER — SODIUM CHLORIDE 0.9 % IV SOLN
3.0000 g | Freq: Four times a day (QID) | INTRAVENOUS | Status: DC
  Filled 2015-01-02 (×2): qty 3

## 2015-01-02 MED ORDER — DIBUCAINE 1 % RE OINT
1.0000 "application " | TOPICAL_OINTMENT | RECTAL | Status: DC | PRN
  Filled 2015-01-02: qty 28

## 2015-01-02 MED ORDER — OXYCODONE-ACETAMINOPHEN 5-325 MG PO TABS
1.0000 | ORAL_TABLET | ORAL | Status: DC | PRN
  Administered 2015-01-02 – 2015-01-04 (×4): 1 via ORAL
  Filled 2015-01-02 (×3): qty 1

## 2015-01-02 MED ORDER — SIMETHICONE 80 MG PO CHEW: 80.0000 mg | CHEWABLE_TABLET | ORAL | Status: DC | PRN

## 2015-01-02 MED ORDER — OXYCODONE-ACETAMINOPHEN 5-325 MG PO TABS: 2.0000 | ORAL_TABLET | ORAL | Status: DC | PRN

## 2015-01-02 MED ORDER — TETANUS-DIPHTH-ACELL PERTUSSIS 5-2.5-18.5 LF-MCG/0.5 IM SUSP: 0.5000 mL | Freq: Once | INTRAMUSCULAR | Status: DC

## 2015-01-02 MED ORDER — LANOLIN HYDROUS EX OINT: TOPICAL_OINTMENT | CUTANEOUS | Status: DC | PRN

## 2015-01-02 MED ORDER — BENZOCAINE-MENTHOL 20-0.5 % EX AERO
1.0000 "application " | INHALATION_SPRAY | CUTANEOUS | Status: DC | PRN
  Administered 2015-01-02: 1 via TOPICAL
  Filled 2015-01-02 (×2): qty 56

## 2015-01-02 MED ORDER — SENNOSIDES-DOCUSATE SODIUM 8.6-50 MG PO TABS
2.0000 | ORAL_TABLET | ORAL | Status: DC
  Administered 2015-01-02 – 2015-01-03 (×2): 2 via ORAL
  Filled 2015-01-02 (×2): qty 2

## 2015-01-02 MED ORDER — WITCH HAZEL-GLYCERIN EX PADS: 1.0000 "application " | MEDICATED_PAD | CUTANEOUS | Status: DC | PRN

## 2015-01-02 MED ORDER — IBUPROFEN 600 MG PO TABS
600.0000 mg | ORAL_TABLET | Freq: Four times a day (QID) | ORAL | Status: DC
  Administered 2015-01-02 – 2015-01-04 (×8): 600 mg via ORAL
  Filled 2015-01-02 (×8): qty 1

## 2015-01-02 MED ORDER — LACTATED RINGERS IV SOLN
INTRAVENOUS | Status: DC
  Administered 2015-01-02: 04:00:00 via INTRAUTERINE

## 2015-01-02 MED ORDER — ONDANSETRON HCL 4 MG PO TABS: 4.0000 mg | ORAL_TABLET | ORAL | Status: DC | PRN

## 2015-01-02 MED ORDER — ACETAMINOPHEN 500 MG PO TABS: 1000.0000 mg | ORAL_TABLET | Freq: Four times a day (QID) | ORAL | Status: DC | PRN

## 2015-01-02 MED ORDER — PRENATAL MULTIVITAMIN CH
1.0000 | ORAL_TABLET | Freq: Every day | ORAL | Status: DC
  Administered 2015-01-02 – 2015-01-03 (×2): 1 via ORAL
  Filled 2015-01-02 (×2): qty 1

## 2015-01-02 MED ORDER — ACETAMINOPHEN 325 MG PO TABS: 650.0000 mg | ORAL_TABLET | ORAL | Status: DC | PRN

## 2015-01-02 MED ORDER — LORATADINE 10 MG PO TABS
10.0000 mg | ORAL_TABLET | Freq: Every day | ORAL | Status: DC
  Filled 2015-01-02: qty 1

## 2015-01-02 MED ORDER — ONDANSETRON HCL 4 MG/2ML IJ SOLN: 4.0000 mg | INTRAMUSCULAR | Status: DC | PRN

## 2015-01-02 NOTE — Progress Notes (Signed)
Labor Progress Note  Dawn ManuelDonjeta Campbell is a 19 y.o. G1P0 at 6442w2d  admitted for induction of labor due to Post dates.   S: No concerns.   O:  BP 116/77 mmHg  Pulse 100  Temp(Src) 98 F (36.7 C) (Oral)  Resp 15  Ht 4' 10.5" (1.486 m)  Wt 143 lb (64.864 kg)  BMI 29.37 kg/m2  SpO2 98%  LMP 03/19/2014 FHT:  FHR: 160 bpm, variability: minimal ,  accelerations:  Abscent,  decelerations:  Present Late UC:   regular, every 1-2 minutes SVE:   Dilation: Lip/rim Effacement (%): 100 Station: 0 Exam by:: C.Okoroji RB/T.Willis RN  Pitocin @ 18 mu/min  Labs: Lab Results  Component Value Date   WBC 11.1* 01/01/2015   HGB 11.4* 01/01/2015   HCT 34.6* 01/01/2015   MCV 84.4 01/01/2015   PLT 208 01/01/2015    Assessment / Plan: 19 y.o. G1P0 3442w2d in active labor Induction of labor due to postterm,  progressing well on pitocin  Labor: Progressing normally, will turn off Pit and try and labor down in setting of decels. Will try intrauterine resuscitation methods. Close monitoring and intervention as needed. Fetal Wellbeing:  Category II, will discuss with team Pain Control:  Epidural Anticipated MOD:  NSVD  Expectant management   Dawn AdaJazma Phelps, DO 01/02/2015, 3:07 AM PGY-1, [facility]

## 2015-01-02 NOTE — Progress Notes (Signed)
Pt in WC and complaints of nausea.  Vomiting x2.  Pt states she feels dizzy and about to pass out.  Assistance to room. Ammonia used. Restarted IV fluids while pt in WC.  Assisted back to bed.  VS obtained q475min.  Will continue to monitor.    Regular diet given to pt.  Family at bedside.

## 2015-01-02 NOTE — Lactation Note (Signed)
This note was copied from the [facility]. Lactation Consultation Note New mom worried about baby not getting any milk. Breast feel heavy. Mom states had changes in breast during pregnancy. Hand expression taught, no colostrum noted. Mom has very short shaft semi flat nipples, compresses slightly flat. Mom has generalized edema to LE. I feel breast has edema as well. Reverse pressure to nipples. Fitted mom w/#16 NS for small nipples. Mom kept asking for formula. Discussed mom's BF plan which is to BF for 1 yr. Mom stated she may supplement w/formula until her milk comes in. Grandmother in room (Spanish speaking) kept talking about baby hungry needs to eat mom stated. Discussed colostrum thickness and rich in nutrients that's why it comes first. Explained baby's small tummy.  W/gloved finger attempted baby to assess baby's suck. Wouldn't suckle on finger. Inserted formula into NS w/syring to get baby latched. Needed some stimulation to suckle on breast. Baby finally after rooting and crying suckled w/small amount of formula squirted on tongue latched onto NS. Discussed positioning and props. Taught application and care of NS.  Shells given to mom to wear in bra in am. Taught application and care between BF to assist in everting nipples for deeper latch. Hand pump given to pre-pump and stimulate nipples to evert more and "prime the line" for BF. Mom had lots of questions. Mom encouraged to feed baby 8-12 times/24 hours and with feeding cues. Mom encouraged to waken baby for feeds. Referred to Baby and Me Book in Breastfeeding section Pg. 22-23 for position options and Proper latch demonstration. Educated about newborn behavior, cluster feeding supply and demand and I&O. Mom encouraged to do skin-to-skin. WH/LC brochure given w/resources, support groups and LC services. Mom speaks good [place] and denied need for interpreter.  Patient Name: [name] Today's Date: 01/02/2015 Reason for  consult: Initial assessment   Maternal Data Has patient been taught Hand Expression?: Yes Does the patient have breastfeeding experience prior to this delivery?: No  Feeding Feeding Type: Formula Length of feed: 10 min  LATCH Score/Interventions Latch: Repeated attempts needed to sustain latch, nipple held in mouth throughout feeding, stimulation needed to elicit sucking reflex. Intervention(s): Adjust position;Assist with latch;Breast massage;Breast compression  Audible Swallowing: None Intervention(s): Skin to skin;Hand expression  Type of Nipple: Flat (semi flat, very short shaft) Intervention(s): Hand pump;Shells;Reverse pressure  Comfort (Breast/Nipple): Soft / non-tender     Hold (Positioning): Assistance needed to correctly position infant at breast and maintain latch. Intervention(s): Skin to skin;Position options;Support Pillows;Breastfeeding basics reviewed  LATCH Score: 5  Lactation Tools Discussed/Used Tools: Shells;Nipple Dawn CarnesShields;Pump Nipple shield size: 16 Shell Type: Inverted Breast pump type: Manual Pump Review: Setup, frequency, and cleaning;Milk Storage Initiated by:: Peri JeffersonL. Dawn Hilleary RN Date initiated:: 01/02/15   Consult Status Consult Status: Follow-up Date: 01/03/15 Follow-up type: In-patient    Dawn DancerCARVER, [name] 01/02/2015, 10:06 PM

## 2015-01-02 NOTE — Progress Notes (Signed)
Maryln ManuelDonjeta Strickland is a 19 y.o. G1P0 at 5131w2d   Subjective: Pushing x 1.5hrs now- making good progress; fluid noted to have previously been clear and now w/ thick mec  Objective: BP 115/61 mmHg  Pulse 102  Temp(Src) 100.8 F (38.2 C) (Oral)  Resp 15  Ht 4' 10.5" (1.486 m)  Wt 64.864 kg (143 lb)  BMI 29.37 kg/m2  SpO2 98%  LMP 03/19/2014      FHT:  FHR: 170s bpm, variability: minimal ,  accelerations:  Abscent,  decelerations:  Absent UC:   regular, every 2 minutes w/ Pit @ 322mu/min SVE:  Seeing half dollar-sized circle of head w/ pushing  Labs: Lab Results  Component Value Date   WBC 11.1* 01/01/2015   HGB 11.4* 01/01/2015   HCT 34.6* 01/01/2015   MCV 84.4 01/01/2015   PLT 208 01/01/2015    Assessment / Plan: Given dose of Unasyn 2gm Pit taken up to 514mu/min to strengthen ctx Continue pushing Dr Despina HiddenEure updated  Cam HaiSHAW, Curry Dulski CNM 01/02/2015, 6:33 AM

## 2015-01-02 NOTE — Progress Notes (Signed)
Pt to bathroom on stedy with some dizziness and faintness.  Able to void a good amount.  Nausea while riding back to bed with more faintness. Ammonia used with cool rag.  Pt able to get back to bed and feeling better.  Family at bedside. Will continue to monitor

## 2015-01-02 NOTE — Progress Notes (Signed)
Pt states feeling much better.  Resident updated on pt status.  Continue current bag of cluids.

## 2015-01-02 NOTE — Progress Notes (Signed)
Pt up to bathroom with stedy.  No faintness, slight dizziness.  Tolerated well

## 2015-01-02 NOTE — Anesthesia Postprocedure Evaluation (Signed)
  Anesthesia Post-op Note  Patient: [name]  Procedure(s) Performed: * No procedures listed *  Patient Location: PACU and Mother/Baby  Anesthesia Type:Epidural  Level of Consciousness: awake, alert  and oriented  Airway and Oxygen Therapy: Patient Spontanous Breathing  Post-op Pain: none  Post-op Assessment: Post-op Vital signs reviewed and Patient's Cardiovascular Status Stable  Post-op Vital Signs: Reviewed and stable  Last Vitals:  Filed Vitals:   01/02/15 1145  BP: 104/48  Pulse: 97  Temp: 36.8 C  Resp: 18    Complications: No apparent anesthesia complications

## 2015-01-02 NOTE — Progress Notes (Signed)
Pt feeling dizzy again.  IV bolus.  Cool cloths to forehead.  No vomiting.  Continue to monitor.

## 2015-01-02 NOTE — Anesthesia Postprocedure Evaluation (Signed)
  Anesthesia Post-op Note  Patient: [name]  Procedure(s) Performed: * No procedures listed *  Patient Location: PACU and Mother/Baby  Anesthesia Type:Epidural  Level of Consciousness: awake, alert , oriented and patient cooperative  Airway and Oxygen Therapy: Patient Spontanous Breathing  Post-op Pain: none  Post-op Assessment: Post-op Vital signs reviewed, Patient's Cardiovascular Status Stable, Respiratory Function Stable, Patent Airway, No signs of Nausea or vomiting, Adequate PO intake, Pain level controlled, No headache, No backache, No residual numbness and No residual motor weakness  Post-op Vital Signs: Reviewed and stable  Last Vitals:  Filed Vitals:   01/02/15 1145  BP: 104/48  Pulse: 97  Temp: 36.8 C  Resp: 18    Complications: No apparent anesthesia complications

## 2015-01-03 LAB — CBC
HCT: 20.3 % — ABNORMAL LOW (ref 36.0–46.0)
Hemoglobin: 6.8 g/dL — CL (ref 12.0–15.0)
MCH: 28.5 pg (ref 26.0–34.0)
MCHC: 33.5 g/dL (ref 30.0–36.0)
MCV: 84.9 fL (ref 78.0–100.0)
Platelets: 150 10*3/uL (ref 150–400)
RBC: 2.39 MIL/uL — ABNORMAL LOW (ref 3.87–5.11)
RDW: 15 % (ref 11.5–15.5)
WBC: 13.8 10*3/uL — ABNORMAL HIGH (ref 4.0–10.5)

## 2015-01-03 LAB — HEMOGLOBIN AND HEMATOCRIT, BLOOD
HCT: 27.1 % — ABNORMAL LOW (ref 36.0–46.0)
HEMOGLOBIN: 9.2 g/dL — AB (ref 12.0–15.0)

## 2015-01-03 LAB — PREPARE RBC (CROSSMATCH)

## 2015-01-03 LAB — ABO/RH: ABO/RH(D): O POS

## 2015-01-03 MED ORDER — BENZOCAINE-MENTHOL 20-0.5 % EX AERO: 1.0000 "application " | INHALATION_SPRAY | CUTANEOUS | Status: AC | PRN

## 2015-01-03 MED ORDER — IBUPROFEN 600 MG PO TABS: 600.0000 mg | ORAL_TABLET | Freq: Four times a day (QID) | ORAL | Status: DC

## 2015-01-03 MED ORDER — SENNOSIDES-DOCUSATE SODIUM 8.6-50 MG PO TABS: 2.0000 | ORAL_TABLET | ORAL | Status: AC | PRN

## 2015-01-03 MED ORDER — SODIUM CHLORIDE 0.9 % IV SOLN: Freq: Once | INTRAVENOUS | Status: DC

## 2015-01-03 MED ORDER — FERROUS SULFATE 325 (65 FE) MG PO TABS
325.0000 mg | ORAL_TABLET | Freq: Every day | ORAL | Status: DC
  Administered 2015-01-04: 325 mg via ORAL
  Filled 2015-01-03: qty 1

## 2015-01-03 NOTE — Progress Notes (Signed)
Post Partum Day 1  Subjective:  [name] is a 19 y.o. G1P1001 7237w2d s/p NSVD.  No acute events overnight.  Pt denies problems with ambulating, voiding or po intake.  She denies nausea or vomiting.  Pain is well controlled.  She has had flatus. She has not had bowel movement.  Lochia Moderate.  Plan for birth control is oral contraceptives (estrogen/progesterone).  Method of Feeding: Breast and bottle. States she is not getting much from her breast and has been working with lactation.   Patient was faint and dizzy all throughout yesterday after birth. She states she feels improved this morning.   Objective: BP 104/69 mmHg  Pulse 93  Temp(Src) 97.9 F (36.6 C) (Oral)  Resp 16  Ht 4' 10.5" (1.486 m)  Wt 143 lb (64.864 kg)  BMI 29.37 kg/m2  SpO2 98%  LMP 03/19/2014  Breastfeeding? Unknown  Physical Exam:  General: alert, cooperative and no distress Lochia:normal flow Chest: CTAB Heart: RRR no m/r/g Abdomen: +BS, soft, nontender, fundus firm DVT Evaluation: No evidence of DVT seen on physical exam.   Recent Labs  01/01/15 0800  HGB 11.4*  HCT 34.6*    Assessment/Plan:  ASSESSMENT: [name] is a 19 y.o. G1P1001 2337w2d ppd #1 s/p NSVD doing well.   -Breastfeeding and Lactation consult -CBC reordered for this AM - blood loss at delivery ~625 -Continue to monitor  -Plan for discharge tomorrow   LOS: 2 days   Caryl AdaJazma Phelps, DO 01/03/2015, 6:44 AM PGY-1, [facility]   I have seen and examined this patient and agree the above assessment. CRESENZO-DISHMAN,Yesenia Locurto 01/05/2015 6:54 PM

## 2015-01-03 NOTE — Discharge Summary (Signed)
Obstetric Discharge Summary Reason for Admission: induction of labor for post-dates Prenatal Procedures: NST and ultrasound Intrapartum Procedures: spontaneous vaginal delivery Postpartum Procedures: none Complications-Operative and Postpartum: 1st degree perineal laceration, sulcus laceration  Delivery Summary: Pt pushed well x 2 hours and at 6:58 AM a viable female was delivered via Vaginal, Spontaneous Delivery (Presentation: OA ). APGAR: 8, 9; weight 8 lb 6.8 oz (3822 g). Peds present at del, but infant crying spont w/ stimulation and bulb suctioning, so they were dismissed. Cord clamped and cut by FOB. Hospital cord blood sample collected. Placenta status: Intact, Spontaneous. Cord: 3 vessels   Anesthesia: Epidural  Episiotomy: None Lacerations: 1st degree;Sulcus;Perineal Suture Repair: 3.0 vicryl Est. Blood Loss (mL): 625   Hospital Course: Active Problems:   Pregnancy   Status post vaginal delivery  Dawn ManuelDonjeta Campbell is a 19 y.o. G1P1001 s/p NSVD.  Patient was admitted for post-dates induction.  She has postpartum course that was complicated by anemia due to blood loss at pregnancy. Patient required 2U of pRBCs. The pt feels ready to go home and  will be discharged with outpatient follow-up.   Today: No acute events overnight.  Pt denies problems with ambulating, voiding or po intake.  She denies nausea or vomiting.  Pain is well controlled.  She has had flatus. She has had bowel movement.  Lochia Small.  Plan for birth control is oral contraceptives (estrogen/progesterone).  Method of Feeding: Bottle and Breast.   Physical Exam:  General: alert, cooperative and no distress Lochia: appropriate Uterine Fundus: firm DVT Evaluation: No evidence of DVT seen on physical exam.  H/H: Lab Results  Component Value Date/Time   HGB 9.2* 01/03/2015 08:07 PM   HCT 27.1* 01/03/2015 08:07 PM   Discharge Diagnoses: Term Pregnancy-delivered  Discharge Information: Date:  01/04/2015 Activity: pelvic rest Diet: routine  Medications: PNV, Ibuprofen and Colace Breast feeding:  Yes, and supplementing with bottle until breast milk in. Condition: stable Instructions: refer to handout Discharge to: home    Medication List    TAKE these medications        acetaminophen 500 MG tablet  Commonly known as:  TYLENOL  Take 500 mg by mouth every 6 (six) hours as needed for headache.     benzocaine-Menthol 20-0.5 % Aero  Commonly known as:  DERMOPLAST  Apply 1 application topically as needed for irritation (perineal discomfort).     cetirizine 10 MG tablet  Commonly known as:  ZYRTEC  Take 1 tablet (10 mg total) by mouth daily.     ibuprofen 600 MG tablet  Commonly known as:  ADVIL,MOTRIN  Take 1 tablet (600 mg total) by mouth every 6 (six) hours.     prenatal multivitamin Tabs tablet  Take 1 tablet by mouth daily at 12 noon.     senna-docusate 8.6-50 MG per tablet  Commonly known as:  Senokot-S  Take 2 tablets by mouth as needed for mild constipation.       Follow-up Information    Schedule an appointment as soon as possible [facility] [place]   Why:  for post-partum visit   Contact information:   [address] [facility] [place] [phone] [phone]      Caryl AdaJazma Phelps, DO 01/04/2015, 7:28 AM PGY-1, [facility]   Seen also by me She is doing well and ready for discharge Agree with note Aviva SignsMarie L Brae Schaafsma, CNM

## 2015-01-03 NOTE — Discharge Instructions (Signed)

## 2015-01-03 NOTE — Progress Notes (Signed)
UR chart review completed.  

## 2015-01-04 ENCOUNTER — Other Ambulatory Visit: Payer: Self-pay | Admitting: Family Medicine

## 2015-01-04 ENCOUNTER — Ambulatory Visit: Payer: Self-pay

## 2015-01-04 ENCOUNTER — Other Ambulatory Visit: Payer: Self-pay | Admitting: Women's Health

## 2015-01-04 DIAGNOSIS — IMO0001 Reserved for inherently not codable concepts without codable children: Secondary | ICD-10-CM

## 2015-01-04 LAB — TYPE AND SCREEN
ABO/RH(D): O POS
Antibody Screen: NEGATIVE
UNIT DIVISION: 0
Unit division: 0

## 2015-01-04 NOTE — Lactation Note (Signed)
This note was copied from the chart of [facility]. Lactation Consultation Note Mom states BF going well. Pumping and putting baby to breast wearing #16 NS. Requested more NS. Gave #20,#16. Encouraged to massage breast during BF to express milk. Supplementing w/formula in bottle w/slow flow nipples until she gets more milk. Baby had 4%weight loss. Discussed supply and demand, I&O, and STS. Discussed the importance of Bf and supplementing can slow milk production. Reminded of of support group and follow up if needed LC OP. Patient Name: [name] ZOXWR'UToday's Date: 01/04/2015 Reason for consult: Follow-up assessment   Maternal Data    Feeding    LATCH Score/Interventions                      Lactation Tools Discussed/Used Nipple shield size: 16;20 Shell Type: Inverted Breast pump type: Manual   Consult Status Consult Status: Complete Date: 01/04/15    Charyl DancerCARVER, Tamanna Whitson G 01/04/2015, 12:08 PM

## 2015-02-19 ENCOUNTER — Encounter (HOSPITAL_COMMUNITY): Payer: Self-pay | Admitting: Emergency Medicine

## 2015-02-19 ENCOUNTER — Emergency Department (INDEPENDENT_AMBULATORY_CARE_PROVIDER_SITE_OTHER)
Admission: EM | Admit: 2015-02-19 | Discharge: 2015-02-19 | Disposition: A | Discharge status: Home / Self Care | Payer: Medicaid Other | Source: Home / Self Care | Attending: Emergency Medicine | Admitting: Emergency Medicine

## 2015-02-19 DIAGNOSIS — M654 Radial styloid tenosynovitis [de Quervain]: Secondary | ICD-10-CM | POA: Diagnosis not present

## 2015-02-19 MED ORDER — IBUPROFEN 600 MG PO TABS: 600.0000 mg | ORAL_TABLET | Freq: Four times a day (QID) | ORAL | Status: AC | PRN

## 2015-02-19 MED ORDER — DICLOFENAC SODIUM 75 MG PO TBEC: 75.0000 mg | DELAYED_RELEASE_TABLET | Freq: Two times a day (BID) | ORAL | Status: DC

## 2015-02-19 NOTE — ED Provider Notes (Signed)
CSN: [id number]     Arrival date & time 02/19/15  1629 History   First MD Initiated Contact with Patient 02/19/15 1757     Chief Complaint  Patient presents with  . Wrist Pain   (Consider location/radiation/quality/duration/timing/severity/associated sxs/prior Treatment) HPI  [name] is a 19 year old woman here for evaluation of left wrist pain. [name] states this started about 2 months ago during the end of her pregnancy. [name] reports pain in the radial wrist. It is worse with thumb extension and abduction. [name] states it feels like her thumb will pop in and out of place.  [name] has not tried any medications. It has gotten to the point where it is difficult for her to pick up her child.  Past Medical History  Diagnosis Date  . Medical history non-contributory    Past Surgical History  Procedure Laterality Date  . Cystectomy  2009    from wrist  . Ganglion cyst excision     Family History  Problem Relation Age of Onset  . Diabetes Mother   . Deep vein thrombosis Mother    History  Substance Use Topics  . Smoking status: Never Smoker   . Smokeless tobacco: Never Used  . Alcohol Use: No   OB History    Gravida Para Term Preterm AB TAB SAB Ectopic Multiple Living   0 1     Review of Systems As in history of present illness Allergies  Review of patient's allergies indicates no known allergies.  Home Medications   Prior to Admission medications   Medication Sig Start Date End Date Taking? Authorizing Provider  acetaminophen (TYLENOL) 500 MG tablet Take 500 mg by mouth every 6 (six) hours as needed for headache.    Historical Provider, MD  benzocaine-Menthol (DERMOPLAST) 20-0.5 % AERO Apply 1 application topically as needed for irritation (perineal discomfort). 01/03/15   Pincus Large, DO  cetirizine (ZYRTEC) 10 MG tablet Take 1 tablet (10 mg total) by mouth daily. Patient taking differently: Take 10 mg by mouth daily as needed for allergies.  12/23/14 12/23/15  Montez Morita, CNM  diclofenac (VOLTAREN) 75 MG EC tablet Take 1 tablet (75 mg total) by mouth 2 (two) times daily. For 1 week, then as needed. 02/19/15   Charm Rings, MD  ibuprofen (ADVIL,MOTRIN) 600 MG tablet Take 1 tablet (600 mg total) by mouth every 6 (six) hours. 01/03/15   Pincus Large, DO  Prenatal Vit-Fe Fumarate-FA (PRENATAL MULTIVITAMIN) TABS tablet Take 1 tablet by mouth daily at 12 noon.    Historical Provider, MD  senna-docusate (SENOKOT-S) 8.6-50 MG per tablet Take 2 tablets by mouth as needed for mild constipation. 01/03/15   Pincus Large, DO   BP 118/78 mmHg  Pulse 83  Temp(Src) 98.3 F (36.8 C) (Oral)  Resp 16  SpO2 98%  LMP 02/11/2015 Physical Exam  Constitutional: [name] is oriented to person, place, and time. [name] appears well-developed and well-nourished. No distress.  Cardiovascular: Normal rate.   Pulmonary/Chest: Effort normal.  Musculoskeletal:  Left hand: 2+ radial pulse. [name] is tender at the radial wrist over the extensor and abductor tendons. Finkelstein's positive. Brisk cap refill in thumb. No joint laxity.  Neurological: [name] is alert and oriented to person, place, and time.    ED Course  Procedures (including critical care time) Labs Review Labs Reviewed - No data to display  Imaging Review No results found.   MDM   1. Tommi Rumps  Quervain's tenosynovitis, left    Thumb spica brace given. Treat with brace, diclofenac, ice. If no improvement in the next week, follow-up with sports medicine.    Charm RingsErin J Peggy Loge, MD 02/19/15 Zollie [id number]

## 2015-02-19 NOTE — ED Notes (Signed)
C/o left wrist pain Denies any injury States feels as if her wrist is out of place since end of May

## 2015-02-19 NOTE — Discharge Instructions (Signed)
De Quervain's Tenosynovitis De Quervain's tenosynovitis involves inflammation of one or two tendon linings (sheaths) or strain of one or two tendons to the thumb: extensor pollicis brevis (EPB), or abductor pollicis longus (APL). This causes pain on the side of the wrist and base of the thumb. Tendon sheaths secrete a fluid that lubricates the tendon, allowing the tendon to move smoothly. When the sheath becomes inflamed, the tendon cannot move freely in the sheath. Both the EPB and APL tendons are important for proper use of the hand. The EPB tendon is important for straightening the thumb. The APL tendon is important for moving the thumb away from the index finger (abducting). The two tendons pass through a small tube (canal) in the wrist, near the base of the thumb. When the tendons become inflamed, pain is usually felt in this area. SYMPTOMS   Pain, tenderness, swelling, warmth, or redness over the base of the thumb and thumb side of the wrist.  Pain that gets worse when straightening the thumb.  Pain that gets worse when moving the thumb away from the index finger, against resistance.  Pain with pinching or gripping.  Locking or catching of the thumb.  Limited motion of the thumb.  Crackling sound (crepitation) when the tendon or thumb is moved or touched.  Fluid-filled cyst in the area of the base of the thumb. PROGNOSIS  This condition is usually curable within 6 weeks, if treated properly with non-surgical treatment and resting of the affected area.  RELATED COMPLICATIONS   Longer healing time if not properly treated or if not given enough time to heal.  Chronic inflammation, causing recurring symptoms of tenosynovitis. Permanent pain or restriction of movement.  Risks of surgery: infection, bleeding, injury to nerves (numbness of the thumb), continued pain, incomplete release of the tendon sheath, recurring symptoms, cutting of the tendons, tendons sliding out of position,  weakness of the thumb, thumb stiffness. TREATMENT  First, treatment involves the use of medicine and ice, to reduce pain and inflammation. Patients are encouraged to stop or modify activities that aggravate the injury. Stretching and strengthening exercises may be advised. Exercises may be completed at home or with a therapist. You may be fitted with a brace or splint, to limit motion and allow the injury to heal. Your caregiver may also choose to give you a corticosteroid injection, to reduce the pain and inflammation. If non-surgical treatment is not successful, surgery may be needed. Most tenosynovitis surgeries are done as outpatient procedures (you go home the same day). Surgery may involve local, regional (whole arm), or general anesthesia.  MEDICATION   If pain medicine is needed, nonsteroidal anti-inflammatory medicines (aspirin and ibuprofen), or other minor pain relievers (acetaminophen), are often advised.  Do not take pain medicine for 7 days before surgery.  Prescription pain relievers are often prescribed only after surgery. Use only as directed and only as much as you need.  Corticosteroid injections may be given if your caregiver thinks they are needed. There is a limited number of times these injections may be given. COLD THERAPY   Cold treatment (icing) should be applied for 10 to 15 minutes every 2 to 3 hours for inflammation and pain, and immediately after activity that aggravates your symptoms. Use ice packs or an ice massage. SEEK MEDICAL CARE IF:   Symptoms get worse or do not improve in 2 to 4 weeks, despite treatment.  You experience pain, numbness, or coldness in the hand.  Blue, gray, or dark color appears  in the fingernails.  Any of the following occur after surgery: increased pain, swelling, redness, drainage of fluids, bleeding in the affected area, or signs of infection.  New, unexplained symptoms develop. (Drugs used in treatment may produce side  effects.) Document Released: 07/19/2005 Document Revised: 10/11/2011 Document Reviewed: 10/31/2008 [facility] Patient Information 2015 [employer]. This information is not intended to replace advice given to you by your health care provider. Make sure you discuss any questions you have with your health care provider.

## 2015-02-27 ENCOUNTER — Emergency Department (HOSPITAL_COMMUNITY)
Admission: EM | Admit: 2015-02-27 | Discharge: 2015-02-27 | Disposition: A | Discharge status: Home / Self Care | Payer: Medicaid Other | Attending: Emergency Medicine | Admitting: Emergency Medicine

## 2015-02-27 ENCOUNTER — Encounter (HOSPITAL_COMMUNITY): Payer: Self-pay | Admitting: Nurse Practitioner

## 2015-02-27 ENCOUNTER — Emergency Department (HOSPITAL_COMMUNITY): Payer: Medicaid Other

## 2015-02-27 DIAGNOSIS — M778 Other enthesopathies, not elsewhere classified: Secondary | ICD-10-CM | POA: Insufficient documentation

## 2015-02-27 DIAGNOSIS — Z79899 Other long term (current) drug therapy: Secondary | ICD-10-CM | POA: Diagnosis not present

## 2015-02-27 DIAGNOSIS — M25532 Pain in left wrist: Secondary | ICD-10-CM | POA: Diagnosis present

## 2015-02-27 MED ORDER — PREDNISONE 10 MG (21) PO TBPK: ORAL_TABLET | ORAL | Status: AC

## 2015-02-27 NOTE — ED Provider Notes (Signed)
CSN: [id number]     Arrival date & time 02/27/15  1725 History  This chart was scribed for Langston Masker, PA-C working with Gwyneth Sprout, MD by Elveria Rising, ED Scribe. This patient was seen in room TR07C/TR07C and the patient's care was started at 6:24 PM.   Chief Complaint  Patient presents with  . Wrist Pain   The history is provided by the patient. No language interpreter was used.   HPI Comments: [name] is a 19 y.o. female who presents to the Emergency Department complaining of unimproved left wrist pain. Patient reports sensation of "cracking" in her wrist and states that she is unable to perform mundane tasks such as driving, picking up her child, etc due to pain severity with use. Patient locates pain to primarily to thumb.  Patient evaluated at [facility] 7/20 for her wrist pain ongoing for two months. Patient was diagnosed with Suzette Battiest tenosynovitis, discharged with prescriptions for Voltaren and ibuprofen, placed in a thumb spica and advised to follow up with sports medicine if her pain did not improve over the next week.   Past Medical History  Diagnosis Date  . Medical history non-contributory    Past Surgical History  Procedure Laterality Date  . Cystectomy  2009    from wrist  . Ganglion cyst excision     Family History  Problem Relation Age of Onset  . Diabetes Mother   . Deep vein thrombosis Mother    History  Substance Use Topics  . Smoking status: Never Smoker   . Smokeless tobacco: Never Used  . Alcohol Use: No   OB History    Gravida Para Term Preterm AB TAB SAB Ectopic Multiple Living   1 1 1       0 1     Review of Systems  Constitutional: Negative for fever.  Musculoskeletal: Positive for arthralgias.  Neurological: Negative for weakness and numbness.  All other systems reviewed and are negative.     Allergies  Review of patient's allergies indicates no known allergies.  Home Medications   Prior to Admission medications    Medication Sig Start Date End Date Taking? Authorizing Provider  acetaminophen (TYLENOL) 500 MG tablet Take 500 mg by mouth every 6 (six) hours as needed for headache.    Historical Provider, MD  benzocaine-Menthol (DERMOPLAST) 20-0.5 % AERO Apply 1 application topically as needed for irritation (perineal discomfort). 01/03/15   Pincus Large, DO  cetirizine (ZYRTEC) 10 MG tablet Take 1 tablet (10 mg total) by mouth daily. Patient taking differently: Take 10 mg by mouth daily as needed for allergies.  12/23/14 12/23/15  Montez Morita, CNM  ibuprofen (ADVIL,MOTRIN) 600 MG tablet Take 1 tablet (600 mg total) by mouth every 6 (six) hours as needed for moderate pain. 02/19/15   Charm Rings, MD  Prenatal Vit-Fe Fumarate-FA (PRENATAL MULTIVITAMIN) TABS tablet Take 1 tablet by mouth daily at 12 noon.    Historical Provider, MD  senna-docusate (SENOKOT-S) 8.6-50 MG per tablet Take 2 tablets by mouth as needed for mild constipation. 01/03/15   Pincus Large, DO   Triage Vitals: BP 125/67 mmHg  Pulse 107  Temp(Src) 98.8 F (37.1 C) (Oral)  Resp 16  SpO2 100%  LMP 02/11/2015 Physical Exam  Constitutional: She is oriented to person, place, and time. She appears well-developed and well-nourished. No distress.  HENT:  Head: Normocephalic and atraumatic.  Eyes: EOM are normal.  Neck: Neck supple. No tracheal deviation present.  Cardiovascular:  Normal rate.   Pulmonary/Chest: Effort normal. No respiratory distress.  Musculoskeletal: Normal range of motion.  Pain with ROM left thumb. No deformity. no swelling.   Neurological: She is alert and oriented to person, place, and time.  Skin: Skin is warm and dry.  Psychiatric: She has a normal mood and affect. Her behavior is normal.  Nursing note and vitals reviewed.   ED Course  Procedures (including critical care time)  COORDINATION OF CARE: 6:28 PM- Discussed treatment plan with patient at bedside and patient agreed to plan.   Labs Review Labs  Reviewed - No data to display  Imaging Review No results found.   EKG Interpretation None      MDM   Final diagnoses:  Tendonitis of wrist, left    Prednisone taper Follow up with Dr. Merlyn Lot  I personally performed the services in this documentation, which was scribed in my presence.  The recorded information has been reviewed and considered.   Barnet Pall.  Lonia Skinner [place], PA-C 02/27/15 1941  Gwyneth Sprout, MD 02/28/15 Marlyne Beards

## 2015-02-27 NOTE — Discharge Instructions (Signed)
De Quervain's Disease De Quervain's disease is a condition often seen in racquet sports where there is a soreness (inflammation) in the cord like structures (tendons) which attach muscle to bone on the thumb side of the wrist. There may be a tightening of the tissuesaround the tendons. This condition is often helped by giving up or modifying the activity which caused it. When conservative treatment does not help, surgery may be required. Conservative treatment could include changes in the activity which brought about the problem or made it worse. Anti-inflammatory medications and injections may be used to help decrease the inflammation and help with pain control. Your caregiver will help you determine which is best for you. DIAGNOSIS  Often the diagnosis (learning what is wrong) can be made by examination. Sometimes x-rays are required. HOME CARE INSTRUCTIONS   Apply ice to the sore area for 15-20 minutes, 03-04 times per day while awake. Put the ice in a plastic bag and place a towel between the bag of ice and your skin. This is especially helpful if it can be done after all activities involving the sore wrist.  Temporary splinting may help.  Only take over-the-counter or prescription medicines for pain, discomfort or fever as directed by your caregiver. SEEK MEDICAL CARE IF:   Pain relief is not obtained with medications, or if you have increasing pain and seem to be getting worse rather than better. MAKE SURE YOU:   Understand these instructions.  Will watch your condition.  Will get help right away if you are not doing well or get worse. Document Released: 04/13/2001 Document Revised: 10/11/2011 Document Reviewed: 11/21/2013 Exit[employer] Patient Information 2015 [employer]. This information is not intended to replace advice given to you by your health care provider. Make sure you discuss any questions you have with your health care provider.  

## 2015-02-27 NOTE — ED Notes (Signed)
Pt reports she was recently at [facility] for L wrist pain since may, they applied a splint and told her to F/U here if no improvement. She continues to have pain. Cms intact

## 2015-02-27 NOTE — ED Notes (Signed)
Patient is alert and orientedx4.  Patient was explained discharge instructions and they understood them with no questions.   

## 2015-09-20 IMAGING — DX DG WRIST COMPLETE 3+V*L*
4 series · 4 of 4 positions shown · non-contrast
Comparison: None.

CLINICAL DATA: Chronic left wrist pain 2 months.  No injury.

EXAM:
LEFT WRIST - COMPLETE 3+ VIEW

[x wrist pa left]
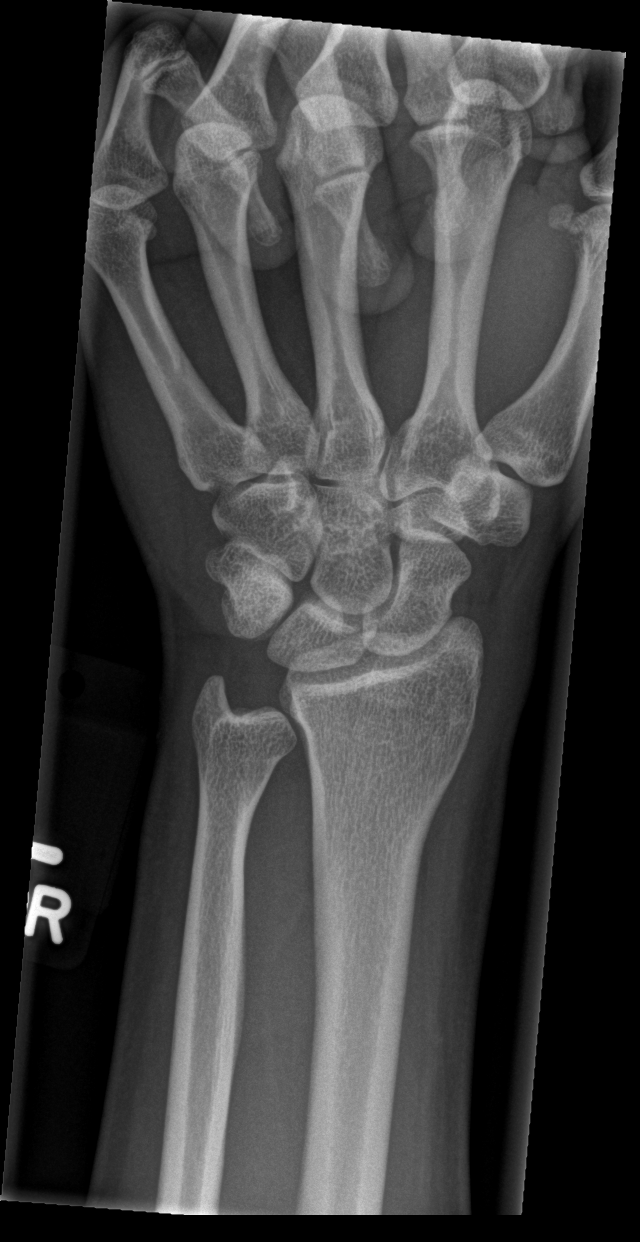

[x wrist obl left]
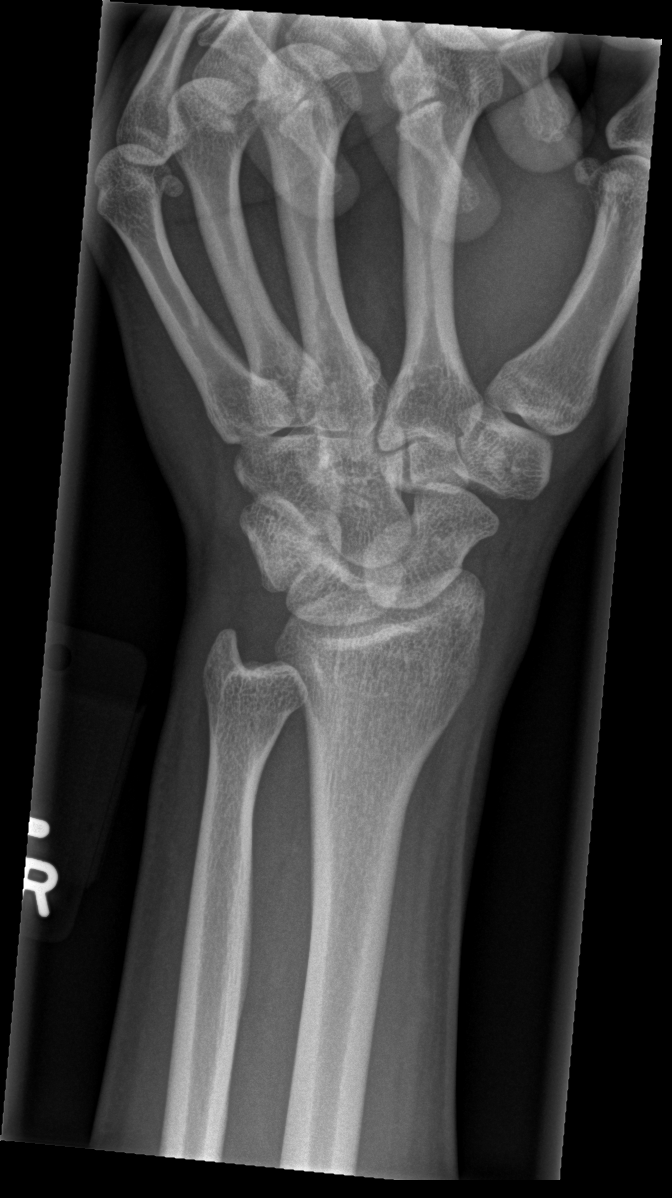

[x wrist lat left]
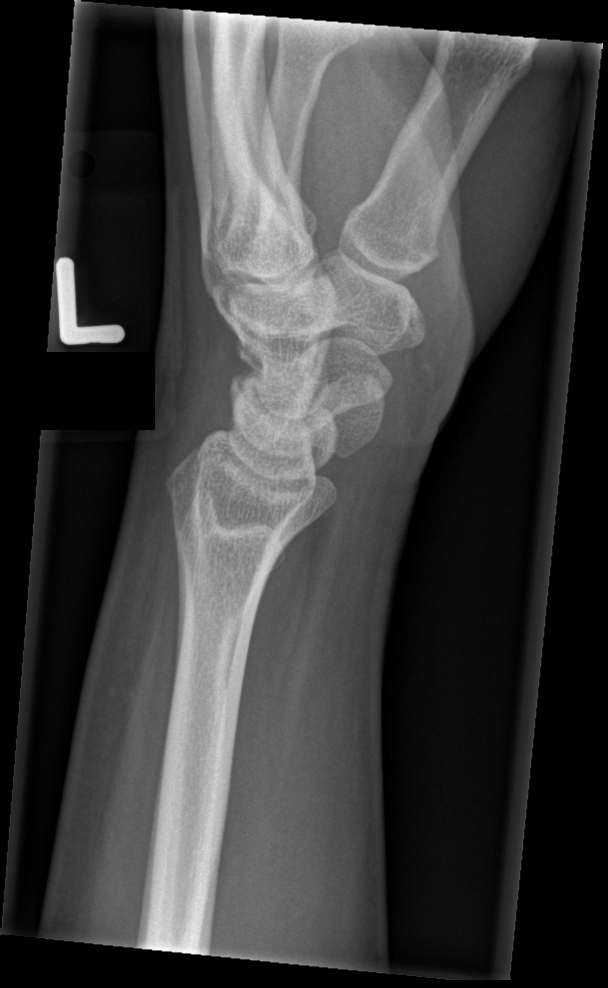

[x wrist navicular view left]
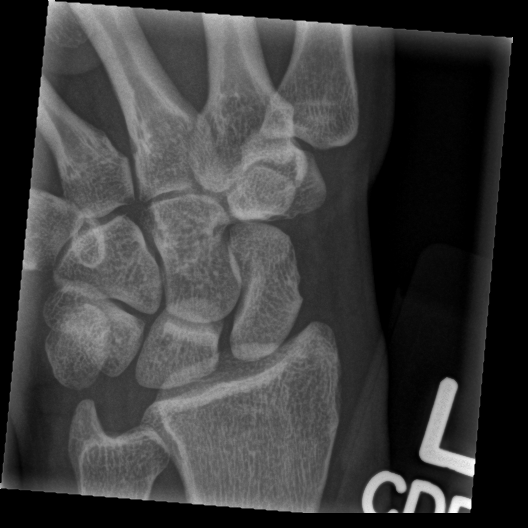

[4 of 4 positions shown; findings below may reference images not displayed]

FINDINGS: There is negative ulnar variance. There is no fracture or other
acute abnormality. No arthritic changes are evident. There is no
bone lesion or bony destruction. No significant soft tissue
abnormality is evident.
IMPRESSION: Negative ulnar variance.  Otherwise unremarkable.
